# Patient Record
Sex: Female | Born: 1990 | Race: White | Hispanic: No | Marital: Single | Smoking: Former smoker
Health system: Southern US, Community
[De-identification: ages and names within clinical notes are randomized; demographics above are authoritative.]

## PROBLEM LIST (undated history)

## (undated) DIAGNOSIS — Z8619 Personal history of other infectious and parasitic diseases: Secondary | ICD-10-CM

## (undated) DIAGNOSIS — B9689 Other specified bacterial agents as the cause of diseases classified elsewhere: Secondary | ICD-10-CM

## (undated) DIAGNOSIS — N760 Acute vaginitis: Secondary | ICD-10-CM

## (undated) DIAGNOSIS — Z8742 Personal history of other diseases of the female genital tract: Secondary | ICD-10-CM

## (undated) HISTORY — DX: Personal history of other infectious and parasitic diseases: Z86.19

## (undated) HISTORY — DX: Acute vaginitis: N76.0

## (undated) HISTORY — PX: RECONSTRUCTION OF NOSE: SHX2301

## (undated) HISTORY — DX: Other specified bacterial agents as the cause of diseases classified elsewhere: B96.89

## (undated) HISTORY — DX: Personal history of other diseases of the female genital tract: Z87.42

---
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
## 2009-06-18 DIAGNOSIS — B9689 Other specified bacterial agents as the cause of diseases classified elsewhere: Secondary | ICD-10-CM

## 2009-06-18 HISTORY — DX: Acute vaginitis: B96.89

## 2011-04-11 ENCOUNTER — Encounter (HOSPITAL_COMMUNITY): Payer: Self-pay | Admitting: Emergency Medicine

## 2011-04-11 ENCOUNTER — Emergency Department (HOSPITAL_COMMUNITY)
Admission: EM | Admit: 2011-04-11 | Discharge: 2011-04-11 | Disposition: A | Discharge status: Home / Self Care | Payer: 59 | Attending: Emergency Medicine | Admitting: Emergency Medicine

## 2011-04-11 DIAGNOSIS — F17200 Nicotine dependence, unspecified, uncomplicated: Secondary | ICD-10-CM | POA: Insufficient documentation

## 2011-04-11 DIAGNOSIS — R51 Headache: Secondary | ICD-10-CM | POA: Insufficient documentation

## 2011-04-11 MED ORDER — KETOROLAC TROMETHAMINE 60 MG/2ML IM SOLN
60.0000 mg | Freq: Once | INTRAMUSCULAR | Status: AC
  Administered 2011-04-11: 60 mg via INTRAMUSCULAR
  Filled 2011-04-11: qty 2
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
## 2011-04-11 NOTE — ED Provider Notes (Signed)
Medical screening examination/treatment/procedure(s) were performed by non-physician practitioner and as supervising physician I was immediately available for consultation/collaboration.   Dafney Farler, MD 04/11/11 2340 

## 2011-06-29 ENCOUNTER — Ambulatory Visit: Payer: Self-pay | Admitting: Obstetrics and Gynecology

## 2012-11-13 ENCOUNTER — Other Ambulatory Visit: Payer: Self-pay | Admitting: Family Medicine

## 2012-11-13 ENCOUNTER — Other Ambulatory Visit (HOSPITAL_COMMUNITY)
Admission: RE | Admit: 2012-11-13 | Discharge: 2012-11-13 | Disposition: A | Discharge status: Home / Self Care | Payer: 59 | Source: Ambulatory Visit | Attending: Family Medicine | Admitting: Family Medicine

## 2012-11-13 DIAGNOSIS — Z01419 Encounter for gynecological examination (general) (routine) without abnormal findings: Secondary | ICD-10-CM | POA: Insufficient documentation

## 2012-11-13 DIAGNOSIS — Z113 Encounter for screening for infections with a predominantly sexual mode of transmission: Secondary | ICD-10-CM | POA: Insufficient documentation

## 2014-07-21 LAB — OB RESULTS CONSOLE ANTIBODY SCREEN: Antibody Screen: NEGATIVE

## 2014-07-21 LAB — OB RESULTS CONSOLE ABO/RH: RH TYPE: NEGATIVE

## 2014-07-21 LAB — OB RESULTS CONSOLE GC/CHLAMYDIA
Chlamydia: NEGATIVE
Gonorrhea: NEGATIVE

## 2014-07-21 LAB — OB RESULTS CONSOLE RPR: RPR: NONREACTIVE

## 2014-07-21 LAB — OB RESULTS CONSOLE HEPATITIS B SURFACE ANTIGEN: HEP B S AG: NEGATIVE

## 2014-07-21 LAB — OB RESULTS CONSOLE RUBELLA ANTIBODY, IGM: RUBELLA: IMMUNE

## 2014-07-21 LAB — OB RESULTS CONSOLE HIV ANTIBODY (ROUTINE TESTING): HIV: NONREACTIVE

## 2015-03-08 ENCOUNTER — Inpatient Hospital Stay (HOSPITAL_COMMUNITY)
Admission: AD | Admit: 2015-03-08 | Discharge: 2015-03-11 | DRG: 775 | Disposition: A | Discharge status: Home / Self Care | Payer: Commercial Managed Care - HMO | Source: Ambulatory Visit | Attending: Obstetrics and Gynecology | Admitting: Obstetrics and Gynecology

## 2015-03-08 ENCOUNTER — Encounter (HOSPITAL_COMMUNITY): Payer: Self-pay | Admitting: *Deleted

## 2015-03-08 DIAGNOSIS — D649 Anemia, unspecified: Secondary | ICD-10-CM

## 2015-03-08 DIAGNOSIS — Z825 Family history of asthma and other chronic lower respiratory diseases: Secondary | ICD-10-CM

## 2015-03-08 DIAGNOSIS — O480 Post-term pregnancy: Principal | ICD-10-CM | POA: Diagnosis present

## 2015-03-08 DIAGNOSIS — Z811 Family history of alcohol abuse and dependence: Secondary | ICD-10-CM

## 2015-03-08 DIAGNOSIS — O99344 Other mental disorders complicating childbirth: Secondary | ICD-10-CM | POA: Diagnosis present

## 2015-03-08 DIAGNOSIS — Z6791 Unspecified blood type, Rh negative: Secondary | ICD-10-CM

## 2015-03-08 DIAGNOSIS — Z823 Family history of stroke: Secondary | ICD-10-CM

## 2015-03-08 DIAGNOSIS — R51 Headache: Secondary | ICD-10-CM | POA: Diagnosis present

## 2015-03-08 DIAGNOSIS — F419 Anxiety disorder, unspecified: Secondary | ICD-10-CM

## 2015-03-08 DIAGNOSIS — F17210 Nicotine dependence, cigarettes, uncomplicated: Secondary | ICD-10-CM | POA: Diagnosis present

## 2015-03-08 DIAGNOSIS — Z6830 Body mass index (BMI) 30.0-30.9, adult: Secondary | ICD-10-CM

## 2015-03-08 DIAGNOSIS — Z3A40 40 weeks gestation of pregnancy: Secondary | ICD-10-CM

## 2015-03-08 DIAGNOSIS — O99334 Smoking (tobacco) complicating childbirth: Secondary | ICD-10-CM | POA: Diagnosis present

## 2015-03-08 DIAGNOSIS — Z818 Family history of other mental and behavioral disorders: Secondary | ICD-10-CM

## 2015-03-08 DIAGNOSIS — O26899 Other specified pregnancy related conditions, unspecified trimester: Secondary | ICD-10-CM

## 2015-03-08 DIAGNOSIS — Z8249 Family history of ischemic heart disease and other diseases of the circulatory system: Secondary | ICD-10-CM

## 2015-03-08 NOTE — MAU Note (Signed)
Pt reports contractions q 4-6 minutes.

## 2015-03-08 NOTE — L&D Delivery Note (Signed)
Final Labor Progress Note Prior to delivery FHR 120, remained reassuring with occasional variable decelerations. VE C/C/+2+3  Vaginal Delivery Note The pt utilized an epidural as pain management.   Artificial rupture of membranes today, at 0335, clear by Rachael Molina, CNM.  GBS was negtive.     Pushing with guidance began at  0803.   Soon after pushing began FHR deceled to the 90's.  At 0804 active intrauterine resuscitative measures began including replacement of FSE.  At 0808 the call for assistance to faculty practice physicians and Rachael Molina.  Rachael Molina arrived at the bedside at [phone].  Update given, assessment done, care assumed and decision for a vacuum was called.  A Kiwi vacuum was placed at 0813 by Rachael Molina.  Pushing continued with vacuum assistance.  Rachael Molina call into the room and was given an update.  At 0818 the FHR returned to baseline, moderate variability noted.   Pushing continued under Rachael Molina supervision.  At [phone] the tracing was reassessed, FHR was found to be WNL, I reassumed care and pushing continued.    After a total of 30 minutes of pushing, the head, shoulders and the body of a viable female infant "Rachael Molina" delivered spontaneously with maternal effort in the ROA position at [phone].  Leg cord x1, foot cord x1,  infant somersaulted thru without difficulty.  Moderate to thick meconium and a true knot was appreciated.   The infant was noted to have deminished tone, dusky color and spontaneous cry.  The infant was placed on moms abd for a quick assessment. After the umbilical cord was clamped it was cut, the infant was taken to the warmer for further assessment. Cord gas was collected and sent.  O2 sat were noted to be lower than normal and the NBN was call to come assess.  Nurse Rachael Molina arrived, soon after the infant was taken to the NICU for observation.  Spontaneous delivery of a intact meconium stain placenta with a 3 vessel cord via Shultz at  [phone].   Episiotomy:  None   The vulva, perineum, vaginal vault, rectum and cervix were inspected and revealed a 2 degree perineal/vaginal, repaired using a 3-0 vicryl on a CT needle and a superficial bilateral periurethral laceration which was repaired using a 4-0 vicryl on a SH.  Lidocaine was not used, the epidural was sufficient for the repair.   Patient tolerated repair well.   Postpartum pitocin as ordered.  Fundus firm, lochia minimum, bleeding under control. EBL 100, QBL 250.  Pt hemodynamically stable.   Sponge, laps and needle count correct and verified with the primary care nurse.  Attending MD available at all times.    Routine postpartum orders    Mother unsure about method of contraception  Mom plans to breastfeed   Placenta to pathology: YES Cord Gases sent to lab: NO Cord blood sent to lab: YES   APGARS:  7 at 1 minute and 9 at 5 minutes Weight:. pending        Rachael Molina, CNM, MSN 03/09/2015. 10:28 AM

## 2015-03-09 ENCOUNTER — Inpatient Hospital Stay (HOSPITAL_COMMUNITY): Payer: Commercial Managed Care - HMO | Admitting: Anesthesiology

## 2015-03-09 ENCOUNTER — Encounter (HOSPITAL_COMMUNITY): Payer: Self-pay | Admitting: *Deleted

## 2015-03-09 DIAGNOSIS — D649 Anemia, unspecified: Secondary | ICD-10-CM | POA: Diagnosis present

## 2015-03-09 DIAGNOSIS — Z3A40 40 weeks gestation of pregnancy: Secondary | ICD-10-CM | POA: Diagnosis not present

## 2015-03-09 DIAGNOSIS — F17210 Nicotine dependence, cigarettes, uncomplicated: Secondary | ICD-10-CM | POA: Diagnosis present

## 2015-03-09 DIAGNOSIS — Z8249 Family history of ischemic heart disease and other diseases of the circulatory system: Secondary | ICD-10-CM | POA: Diagnosis not present

## 2015-03-09 DIAGNOSIS — F419 Anxiety disorder, unspecified: Secondary | ICD-10-CM

## 2015-03-09 DIAGNOSIS — Z811 Family history of alcohol abuse and dependence: Secondary | ICD-10-CM | POA: Diagnosis not present

## 2015-03-09 DIAGNOSIS — Z825 Family history of asthma and other chronic lower respiratory diseases: Secondary | ICD-10-CM | POA: Diagnosis not present

## 2015-03-09 DIAGNOSIS — O99334 Smoking (tobacco) complicating childbirth: Secondary | ICD-10-CM | POA: Diagnosis present

## 2015-03-09 DIAGNOSIS — Z823 Family history of stroke: Secondary | ICD-10-CM | POA: Diagnosis not present

## 2015-03-09 DIAGNOSIS — O480 Post-term pregnancy: Secondary | ICD-10-CM | POA: Diagnosis present

## 2015-03-09 DIAGNOSIS — R51 Headache: Secondary | ICD-10-CM | POA: Diagnosis present

## 2015-03-09 DIAGNOSIS — Z6791 Unspecified blood type, Rh negative: Secondary | ICD-10-CM

## 2015-03-09 DIAGNOSIS — Z6830 Body mass index (BMI) 30.0-30.9, adult: Secondary | ICD-10-CM

## 2015-03-09 DIAGNOSIS — O99344 Other mental disorders complicating childbirth: Secondary | ICD-10-CM | POA: Diagnosis present

## 2015-03-09 DIAGNOSIS — O26899 Other specified pregnancy related conditions, unspecified trimester: Secondary | ICD-10-CM

## 2015-03-09 DIAGNOSIS — Z818 Family history of other mental and behavioral disorders: Secondary | ICD-10-CM | POA: Diagnosis not present

## 2015-03-09 LAB — CBC
HCT: 36.3 % (ref 36.0–46.0)
HEMOGLOBIN: 12.4 g/dL (ref 12.0–15.0)
MCH: 30.5 pg (ref 26.0–34.0)
MCHC: 34.2 g/dL (ref 30.0–36.0)
MCV: 89.4 fL (ref 78.0–100.0)
PLATELETS: 279 10*3/uL (ref 150–400)
RBC: 4.06 MIL/uL (ref 3.87–5.11)
RDW: 13 % (ref 11.5–15.5)
WBC: 14.8 10*3/uL — ABNORMAL HIGH (ref 4.0–10.5)

## 2015-03-09 MED ORDER — MORPHINE SULFATE (PF) 10 MG/ML IV SOLN
10.0000 mg | Freq: Once | INTRAVENOUS | Status: AC
Start: 2015-03-09 — End: 2015-03-09
  Administered 2015-03-09: 10 mg via INTRAMUSCULAR
  Filled 2015-03-09: qty 1

## 2015-03-09 MED ORDER — ONDANSETRON HCL 4 MG/2ML IJ SOLN: 4.0000 mg | INTRAMUSCULAR | Status: DC | PRN

## 2015-03-09 MED ORDER — SIMETHICONE 80 MG PO CHEW: 80.0000 mg | CHEWABLE_TABLET | ORAL | Status: DC | PRN

## 2015-03-09 MED ORDER — CITRIC ACID-SODIUM CITRATE 334-500 MG/5ML PO SOLN: 30.0000 mL | ORAL | Status: DC | PRN

## 2015-03-09 MED ORDER — OXYCODONE-ACETAMINOPHEN 5-325 MG PO TABS
1.0000 | ORAL_TABLET | ORAL | Status: DC | PRN
  Administered 2015-03-10 (×3): 1 via ORAL
  Filled 2015-03-09 (×3): qty 1

## 2015-03-09 MED ORDER — OXYCODONE-ACETAMINOPHEN 5-325 MG PO TABS: 1.0000 | ORAL_TABLET | ORAL | Status: DC | PRN

## 2015-03-09 MED ORDER — DIBUCAINE 1 % RE OINT
1.0000 "application " | TOPICAL_OINTMENT | RECTAL | Status: DC | PRN
  Filled 2015-03-09: qty 28

## 2015-03-09 MED ORDER — TETANUS-DIPHTH-ACELL PERTUSSIS 5-2.5-18.5 LF-MCG/0.5 IM SUSP: 0.5000 mL | Freq: Once | INTRAMUSCULAR | Status: DC

## 2015-03-09 MED ORDER — DIPHENHYDRAMINE HCL 50 MG/ML IJ SOLN: 12.5000 mg | INTRAMUSCULAR | Status: DC | PRN

## 2015-03-09 MED ORDER — ACETAMINOPHEN 325 MG PO TABS
650.0000 mg | ORAL_TABLET | ORAL | Status: DC | PRN
Start: 2015-03-09 — End: 2015-03-09

## 2015-03-09 MED ORDER — OXYCODONE-ACETAMINOPHEN 5-325 MG PO TABS: 2.0000 | ORAL_TABLET | ORAL | Status: DC | PRN

## 2015-03-09 MED ORDER — SENNOSIDES-DOCUSATE SODIUM 8.6-50 MG PO TABS
2.0000 | ORAL_TABLET | ORAL | Status: DC
  Administered 2015-03-10 – 2015-03-11 (×2): 2 via ORAL
  Filled 2015-03-09 (×2): qty 2

## 2015-03-09 MED ORDER — BENZOCAINE-MENTHOL 20-0.5 % EX AERO
1.0000 "application " | INHALATION_SPRAY | CUTANEOUS | Status: DC | PRN
  Administered 2015-03-09: 1 via TOPICAL
  Filled 2015-03-09 (×2): qty 56

## 2015-03-09 MED ORDER — LANOLIN HYDROUS EX OINT: TOPICAL_OINTMENT | CUTANEOUS | Status: DC | PRN

## 2015-03-09 MED ORDER — FLEET ENEMA 7-19 GM/118ML RE ENEM: 1.0000 | ENEMA | RECTAL | Status: DC | PRN

## 2015-03-09 MED ORDER — ONDANSETRON HCL 4 MG PO TABS: 4.0000 mg | ORAL_TABLET | ORAL | Status: DC | PRN

## 2015-03-09 MED ORDER — EPHEDRINE 5 MG/ML INJ: 10.0000 mg | INTRAVENOUS | Status: DC | PRN

## 2015-03-09 MED ORDER — LIDOCAINE HCL (PF) 1 % IJ SOLN
30.0000 mL | INTRAMUSCULAR | Status: DC | PRN
  Filled 2015-03-09: qty 30

## 2015-03-09 MED ORDER — OXYTOCIN BOLUS FROM INFUSION: 500.0000 mL | INTRAVENOUS | Status: DC

## 2015-03-09 MED ORDER — NALBUPHINE HCL 10 MG/ML IJ SOLN: 10.0000 mg | INTRAMUSCULAR | Status: DC | PRN

## 2015-03-09 MED ORDER — DIPHENHYDRAMINE HCL 25 MG PO CAPS: 25.0000 mg | ORAL_CAPSULE | Freq: Four times a day (QID) | ORAL | Status: DC | PRN

## 2015-03-09 MED ORDER — LACTATED RINGERS IV SOLN
INTRAVENOUS | Status: DC
  Administered 2015-03-09: 02:00:00 via INTRAVENOUS
  Administered 2015-03-09: 1000 mL via INTRAVENOUS

## 2015-03-09 MED ORDER — FENTANYL 2.5 MCG/ML BUPIVACAINE 1/10 % EPIDURAL INFUSION (WH - ANES)
14.0000 mL/h | INTRAMUSCULAR | Status: DC | PRN
  Administered 2015-03-09: 14 mL/h via EPIDURAL
  Filled 2015-03-09: qty 125

## 2015-03-09 MED ORDER — LIDOCAINE HCL (PF) 1 % IJ SOLN
INTRAMUSCULAR | Status: DC | PRN
  Administered 2015-03-09 (×2): 4 mL via EPIDURAL

## 2015-03-09 MED ORDER — ZOLPIDEM TARTRATE 5 MG PO TABS: 5.0000 mg | ORAL_TABLET | Freq: Every evening | ORAL | Status: DC | PRN

## 2015-03-09 MED ORDER — PROMETHAZINE HCL 25 MG/ML IJ SOLN
12.5000 mg | Freq: Once | INTRAMUSCULAR | Status: AC
  Administered 2015-03-09: 12.5 mg via INTRAMUSCULAR
  Filled 2015-03-09: qty 1

## 2015-03-09 MED ORDER — HYDROXYZINE HCL 50 MG PO TABS: 50.0000 mg | ORAL_TABLET | Freq: Four times a day (QID) | ORAL | Status: DC | PRN

## 2015-03-09 MED ORDER — PHENYLEPHRINE 40 MCG/ML (10ML) SYRINGE FOR IV PUSH (FOR BLOOD PRESSURE SUPPORT)
80.0000 ug | PREFILLED_SYRINGE | INTRAVENOUS | Status: DC | PRN
Start: 2015-03-09 — End: 2015-03-09
  Filled 2015-03-09: qty 20

## 2015-03-09 MED ORDER — LACTATED RINGERS IV SOLN
500.0000 mL | INTRAVENOUS | Status: DC | PRN
  Administered 2015-03-09: 500 mL via INTRAVENOUS
  Administered 2015-03-09: 1000 mL via INTRAVENOUS

## 2015-03-09 MED ORDER — IBUPROFEN 600 MG PO TABS
600.0000 mg | ORAL_TABLET | Freq: Four times a day (QID) | ORAL | Status: DC
  Administered 2015-03-09 – 2015-03-11 (×9): 600 mg via ORAL
  Filled 2015-03-09 (×9): qty 1

## 2015-03-09 MED ORDER — ACETAMINOPHEN 325 MG PO TABS: 650.0000 mg | ORAL_TABLET | ORAL | Status: DC | PRN

## 2015-03-09 MED ORDER — PRENATAL MULTIVITAMIN CH
1.0000 | ORAL_TABLET | Freq: Every day | ORAL | Status: DC
  Administered 2015-03-09 – 2015-03-11 (×3): 1 via ORAL
  Filled 2015-03-09 (×3): qty 1

## 2015-03-09 MED ORDER — WITCH HAZEL-GLYCERIN EX PADS: 1.0000 "application " | MEDICATED_PAD | CUTANEOUS | Status: DC | PRN

## 2015-03-09 MED ORDER — ONDANSETRON HCL 4 MG/2ML IJ SOLN: 4.0000 mg | Freq: Four times a day (QID) | INTRAMUSCULAR | Status: DC | PRN

## 2015-03-09 MED ORDER — OXYTOCIN 40 UNITS IN LACTATED RINGERS INFUSION - SIMPLE MED
62.5000 mL/h | INTRAVENOUS | Status: DC
Start: 2015-03-09 — End: 2015-03-09
  Administered 2015-03-09: 999 mL/h via INTRAVENOUS
  Filled 2015-03-09: qty 1000

## 2015-03-09 NOTE — Anesthesia Postprocedure Evaluation (Signed)
Anesthesia Post Note  Patient: Rachael Molina  Procedure(s) Performed: * No procedures listed *  Patient location during evaluation: Mother Baby Anesthesia Type: Epidural Level of consciousness: awake Pain management: pain level controlled Vital Signs Assessment: post-procedure vital signs reviewed and stable Respiratory status: spontaneous breathing Cardiovascular status: stable Postop Assessment: no headache, no backache, epidural receding, patient able to bend at knees, no signs of nausea or vomiting and adequate PO intake Anesthetic complications: no    Last Vitals:  Filed Vitals:   03/09/15 1053 03/09/15 1153  BP: 105/56 103/54  Pulse: 73 63  Temp: 37.1 C 37.1 C  Resp: 18 18    Last Pain:  Filed Vitals:   03/09/15 1312  PainSc: 0-No pain                 Herron Fero

## 2015-03-09 NOTE — H&P (Signed)
Rachael Molina is a 25 y.o. female, G1P0 at 40.4 weeks, presenting for regular contractions of increased intensity since ~ 11 pm on 03/08/15. Was 2/80/-2, -1 on initial exam. She was given pain medication and re-evaluated 1 hr later. Cvx at that time was 4/90/-1. She endorses +FM and denies bleeding or leaking. Of particular significance, pt lives ~ 40-45 min from hospital.   Patient Active Problem List   Diagnosis Date Noted  . Rh negative, maternal 03/09/2015  . Anxiety disorder 03/09/2015  . Anemia 03/09/2015  . Normal labor 03/09/2015  . BMI 30.0-30.9,adult 03/09/2015    History of present pregnancy: Patient entered care at 10.6 weeks.   EDC of 03/05/15 was established by sure LMP.   Anatomy scan: 21.4 weeks, with normal findings and a posterior placenta.   Additional [place] evaluations: None.   Significant prenatal events: Normal pregnancy complaints. No MAU visits. Received both flu and Tdap. TWG: 34 lbs.   Last evaluation: Office @ 39.6 wks by Dr. Normand Sloop. FHR 158 bpm. Cvx 0/50/-3. BP 100/60. Wt: 171 lbs.  OB History    Gravida Para Term Preterm AB TAB SAB Ectopic Multiple Living   1              Past Medical History  Diagnosis Date  . Migraine   . H/O measles   . H/O varicella   . H/O dysmenorrhea 03/24/06  . BV (bacterial vaginosis) 06/18/09   Past Surgical History  Procedure Laterality Date  . Reconstruction of nose     Family History: family history includes Asthma in her brother; Diabetes in her father; Heart disease in her father; Hypertension in her father; Mental illness in her mother; Stroke in her father.DM in her PGF, renal failure in her MGM, drug abuse in her paternal uncle, depression in her sister and paternal family history of alcoholism. Social History:  reports that she has been smoking.  She does not have any smokeless tobacco history on file. She reports that she drinks alcohol. She reports that she does not use illicit drugs. Patient is single, with FOB  Salli Quarry) involved and supportive.She is Caucasian with no religious preference. She will accept blood in an emergency. 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    Prenatal Transfer Tool  Maternal Diabetes: No Genetic Screening: Normal Panorama on 09/22/14 Maternal Ultrasounds/Referrals: Normal Fetal Ultrasounds or other Referrals:  None Maternal Substance Abuse:  No Significant Maternal Medications:  Meds include: Other: Iron, PNV Significant Maternal Lab Results: Lab values include: Group B Strep negative, Rh negative (received Rhogam on 12/22/14)  TDAP: 12/10/14 Flu: 12/10/14  ROS: 10 Systems reviewed and are negative for acute change except as noted in the HPI.   No Known Allergies   Dilation: 4 Effacement (%): 90 Station: -1 Exam by:: Sherre Scarlet CNm Blood pressure 111/70, pulse 60, temperature 97.5 F (36.4 C), temperature source Oral, resp. rate 18, height 5' 2.5" (1.588 m), weight 78.472 kg (173 lb), SpO2 98 %.  Chest clear Heart RRR without murmur Abd gravid, NT, FH CWD Pelvic: As above Ext: WNL EFW: 7-8 lbs FHR: BL 130 w/ moderate variability, +accels, no decels UCs: Every 2-5 minutes  Prenatal labs: ABO, Rh: --/--/A NEG (01/02 0145)  Antibody: POS (01/02 0145) Received Rhogam on 12/22/14 Rubella: Immune (07/21/14)  RPR: Nonreactive (05/16 0000)  HBsAg: Negative (05/16 0000)  HIV: Non-reactive (05/16 0000)  GBS: Neg (02/05/15) Sickle cell/Hgb electrophoresis: NA Pap: Neg 08/13/14 GC: Neg 07/21/14 &  02/05/15 Chlamydia: Neg 07/21/14 & 02/05/15 Genetic screenings: Normal Panorama Glucola: Abnormal at 146; 3hr GTT normal   Hgb 14.2 at NOB, 10.9 at 28 weeks   Assessment/Plan: IUP at 40.4 wks Latent labor -- cervical change s/p pain med Cat 1 FHRT GBS neg Rh neg H/O anxiety Lives in remote location Elevated BMI (31) Abnormal 1hr, normal 3hr GTT  [facility]: [facility] Rout[facility] orders Pain med/epidural  prn Active labor management Expect progress and SVD Consult prn   Robyne AskewWILLIAMS, KIMBERLYCNM, MS 03/09/2015, 3:17 AM

## 2015-03-09 NOTE — Anesthesia Preprocedure Evaluation (Signed)
Anesthesia Evaluation  Patient identified by MRN, date of birth, ID band Patient awake    Reviewed: Allergy & Precautions, NPO status , Patient's Chart, lab work & pertinent test results  Airway Mallampati: II  TM Distance: >3 FB Neck ROM: Full    Dental  (+) Teeth Intact   Pulmonary Current Smoker,    breath sounds clear to auscultation       Cardiovascular negative cardio ROS   Rhythm:Regular Rate:Normal     Neuro/Psych  Headaches, PSYCHIATRIC DISORDERS    GI/Hepatic negative GI ROS, Neg liver ROS,   Endo/Other  negative endocrine ROS  Renal/GU negative Renal ROS  negative genitourinary   Musculoskeletal negative musculoskeletal ROS (+)   Abdominal   Peds negative pediatric ROS (+)  Hematology   Anesthesia Other Findings   Reproductive/Obstetrics (+) Pregnancy                             Lab Results  Component Value Date   WBC 14.8* 03/09/2015   HGB 12.4 03/09/2015   HCT 36.3 03/09/2015   MCV 89.4 03/09/2015   PLT 279 03/09/2015   No results found for: INR, PROTIME   Anesthesia Physical Anesthesia Plan  ASA: II  Anesthesia Plan: Epidural   Post-op Pain Management:    Induction:   Airway Management Planned:   Additional Equipment:   Intra-op Plan:   Post-operative Plan:   Informed Consent: I have reviewed the patients History and Physical, chart, labs and discussed the procedure including the risks, benefits and alternatives for the proposed anesthesia with the patient or authorized representative who has indicated his/her understanding and acceptance.     Plan Discussed with:   Anesthesia Plan Comments:         Anesthesia Quick Evaluation

## 2015-03-09 NOTE — Progress Notes (Signed)
V Standard CNM requested RN to bedside for delivery.

## 2015-03-09 NOTE — MAU Provider Note (Signed)
  History  25 yo G1P0 @ 40.4 wks presents unannounced to MAU w/ c/o ctxs every 4-6 minutes x 1 hour. +FM. Denies leaking or bleeding.  Patient Active Problem List   Diagnosis Date Noted  . Rh negative, maternal 03/09/2015  . Anxiety disorder 03/09/2015  . Anemia 03/09/2015    No chief complaint on file.  HPI As above OB History    Gravida Para Term Preterm AB TAB SAB Ectopic Multiple Living   1               Past Medical History  Diagnosis Date  . Migraine   . H/O measles   . H/O varicella   . H/O dysmenorrhea 03/24/06  . BV (bacterial vaginosis) 06/18/09    Past Surgical History  Procedure Laterality Date  . Reconstruction of nose      Family History  Problem Relation Age of Onset  . Mental illness Mother   . Heart disease Father   . Hypertension Father   . Diabetes Father   . Stroke Father   . Asthma Brother     Social History  Substance Use Topics  . Smoking status: Current Every Day Smoker -- 0.50 packs/day  . Smokeless tobacco: None  . Alcohol Use: Yes     Comment: occassional, not hwile pregnant    Allergies: No Known Allergies  Prescriptions prior to admission  Medication Sig Dispense Refill Last Dose  . ferrous sulfate 325 (65 FE) MG tablet Take 325 mg by mouth daily with breakfast.   03/08/2015 at Unknown time  . prenatal vitamin w/FE, FA (PRENATAL 1 + 1) 27-1 MG TABS tablet Take 1 tablet by mouth daily at 12 noon.   03/08/2015 at Unknown time  . Biotin 10 MG TABS Take 1 tablet by mouth daily.   More than a month at Unknown time  . ibuprofen (ADVIL,MOTRIN) 200 MG tablet Take 800 mg by mouth every 6 (six) hours as needed. pain   More than a month at Unknown time    ROS  +Ctxs -VB -LOF +FM Physical Exam   Blood pressure 110/80, pulse 100, temperature 98.2 F (36.8 C), temperature source Oral, resp. rate 20, height 5' 2.5" (1.588 m), weight 78.472 kg (173 lb), SpO2 98 %.  Physical Exam Gen: Anxious & tearful Abdomen: gravid, soft between ctxs,  NT Pelvic: 2/80/-2, -1 per RN FHR: BL 130 w/ moderate variability, +accels, no decels ED Course  Assessment: Latent labor Cat 1 FHRT Anxiety disorder  Plan: Reviewed findings of latent labor. Gave option to ambulate x 2 hours, receive pain medication while in MAU or return home. Pt elected pain medication. Morphine 10 mg IM and Phenergan 12.5 mg IM ordered -- NKDA. Was scheduled for IOL on 03/13/15 w/ NST in office on 03/11/15.     Sherre ScarletWILLIAMS, Bishoy Cupp CNM, MS 03/09/2015 12:40 AM  ADDENDUM: Cvx re-examined and noted to be 4/90/-1.   P: Admit

## 2015-03-09 NOTE — Progress Notes (Signed)
FOB at bedside.  Subjective: Comfortable w/ epidural.  Objective: BP 111/70 mmHg  Pulse 60  Temp(Src) 97.5 F (36.4 C) (Oral)  Resp 18  Ht 5' 2.5" (1.588 m)  Wt 78.472 kg (173 lb)  BMI 31.12 kg/m2  SpO2 98%  Today's Vitals   03/09/15 0257 03/09/15 0300 03/09/15 0305 03/09/15 0310  BP: 102/63 99/59 103/61 111/70  Pulse: 62 59 57 60  Temp:      TempSrc:      Resp: 18 18    Height:      Weight:      SpO2:      PainSc: Asleep Asleep Asleep     FHT: BL 110-120 w/ moderate variability, +accels, audible decels UC:   irregular, every 3-5 minutes SVE: 5-6/90/-1 AROM'd, scant amount of blood tinged fluid noted. FSE placed due to inability to adequately trace FHR externally, and audible decels. Strip noted to be Cat 1 after FSE placed   Assessment:  IUP at 40.4 wks Cat 1 FHRT -- suspect audible decels a result of fetal descent Progressive labor GBS neg   Plan: Expect progress and SVD Consult prn  Sherre ScarletWILLIAMS, Rigby Swamy CNM 03/09/2015, 3:49 AM

## 2015-03-09 NOTE — Lactation Note (Signed)
This note was copied from the chart of Rachael Lelon FrohlichMegan Schnoor. Lactation Consultation Note Initial visit at 8 hours of age.  Mom reports baby just returning to room and was in the nursery for respiratory issue.  Nursery Rn at bedside to check respiratory rate and advised ok to attempt feeding.  Assisted with hand expression nipples are short semiflat and flatten more with compression. Small glisten of colostrum noted on left breast.  Attempted cross cradle hold baby fussy and not willing to latch.  LC gave mom hand pump with instructions on use.  LC attempted hand pumping with nipple slightly more everted.  Encouraged mom to continue to work on hand expression or hand pumping and keep baby STS as much as she can.  Discussed options of spoon feeding if needed and small snappy container given to collect EBM. Baby back to sleep on moms chest.  [facility] given and discussed.  Encouraged to feed with early cues on demand.  Early newborn behavior discussed.  Mom to call for assist as needed.      Patient Name: Rachael Molina KGMWN'UToday's Date: 03/09/2015 Reason for consult: Initial assessment   Maternal Data Has patient been taught Hand Expression?: Yes Does the patient have breastfeeding experience prior to this delivery?: No  Feeding Feeding Type: Breast Fed  LATCH Score/Interventions Latch: Too sleepy or reluctant, no latch achieved, no sucking elicited. Intervention(s): Skin to skin;Teach feeding cues;Waking techniques  Audible Swallowing: None  Type of Nipple: Flat Intervention(s): Hand pump  Comfort (Breast/Nipple): Soft / non-tender     Hold (Positioning): Assistance needed to correctly position infant at breast and maintain latch. Intervention(s): Breastfeeding basics reviewed;Support Pillows;Position options;Skin to skin  LATCH Score: 4  Lactation Tools Discussed/Used WIC Program: No Pump Review: Setup, frequency, and cleaning Initiated by:: JS Date initiated::  03/09/15   Consult Status Consult Status: Follow-up Date: 03/10/15 Follow-up type: In-patient    [name], [name] 03/09/2015, 5:29 PM

## 2015-03-09 NOTE — Progress Notes (Signed)
FOB at bedside.  Subjective: Comfortable w/ epidural. No urge to push.  Objective: BP 108/66 mmHg  Pulse 71  Temp(Src) 98 F (36.7 C) (Oral)  Resp 18  Ht 5' 2.5" (1.588 m)  Wt 78.472 kg (173 lb)  BMI 31.12 kg/m2  SpO2 98% Today's Vitals   03/09/15 0530 03/09/15 0552 03/09/15 0600 03/09/15 0630  BP: 98/66  111/69 108/66  Pulse: 67  63 71  Temp:      TempSrc:      Resp: 18   18  Height:      Weight:      SpO2:      PainSc:  Asleep  Asleep    FHT: BL 120 w/ moderate variability, +accels, intermittent variables, earlys UC:   irregular, every 1-3 minutes SVE:   Dilation: Lip/rim Effacement (%): 90 Station: 0, +1 Exam by:: Thornell MuleK Nusrat Encarnacion CNM @ [phone]   Assessment:  Approaching 2nd stage labor Overall reassuring FHRT GBS neg  Plan: Peanut/position change Expect further progress and SVD  Sherre ScarletWILLIAMS, Abimelec Grochowski CNM 03/09/2015, 6:35 AM

## 2015-03-09 NOTE — Progress Notes (Signed)
UR chart review completed.  

## 2015-03-09 NOTE — Anesthesia Procedure Notes (Signed)
Epidural Patient location during procedure: OB Start time: 03/09/2015 2:38 AM End time: 03/09/2015 2:42 AM  Staffing Anesthesiologist: Shona SimpsonHOLLIS, Rachael Molina Performed by: anesthesiologist   Preanesthetic Checklist Completed: patient identified, site marked, surgical consent, pre-op evaluation, timeout performed, IV checked, risks and benefits discussed and monitors and equipment checked  Epidural Patient position: sitting Prep: ChloraPrep Patient monitoring: heart rate, continuous pulse ox and blood pressure Approach: midline Location: L3-L4 Injection technique: LOR saline  Needle:  Needle type: Tuohy  Needle gauge: 17 G Needle length: 9 cm Catheter type: closed end flexible Catheter size: 20 Guage Test dose: negative and 1.5% lidocaine  Assessment Events: blood not aspirated, injection not painful, no injection resistance and no paresthesia  Additional Notes LOR @ 5  Patient identified. Risks/Benefits/Options discussed with patient including but not limited to bleeding, infection, nerve damage, paralysis, failed block, incomplete pain control, headache, blood pressure changes, nausea, vomiting, reactions to medications, itching and postpartum back pain. Confirmed with bedside nurse the patient's most recent platelet count. Confirmed with patient that they are not currently taking any anticoagulation, have any bleeding history or any family history of bleeding disorders. Patient expressed understanding and wished to proceed. All questions were answered. Sterile technique was used throughout the entire procedure. Please see nursing notes for vital signs. Test dose was given through epidural catheter and negative prior to continuing to dose epidural or start infusion. Warning signs of high block given to the patient including shortness of breath, tingling/numbness in hands, complete motor block, or any concerning symptoms with instructions to call for help. Patient was given instructions on  fall risk and not to get out of bed. All questions and concerns addressed with instructions to call with any issues or inadequate analgesia.    Reason for block:procedure for pain

## 2015-03-10 LAB — CBC
HEMATOCRIT: 33 % — AB (ref 36.0–46.0)
HEMOGLOBIN: 11 g/dL — AB (ref 12.0–15.0)
MCH: 30.8 pg (ref 26.0–34.0)
MCHC: 33.3 g/dL (ref 30.0–36.0)
MCV: 92.4 fL (ref 78.0–100.0)
Platelets: 237 10*3/uL (ref 150–400)
RBC: 3.57 MIL/uL — ABNORMAL LOW (ref 3.87–5.11)
RDW: 13.2 % (ref 11.5–15.5)
WBC: 11.2 10*3/uL — AB (ref 4.0–10.5)

## 2015-03-10 LAB — RPR: RPR Ser Ql: NONREACTIVE

## 2015-03-10 MED ORDER — RHO D IMMUNE GLOBULIN 1500 UNIT/2ML IJ SOSY
300.0000 ug | PREFILLED_SYRINGE | Freq: Once | INTRAMUSCULAR | Status: AC
Start: 2015-03-10 — End: 2015-03-10
  Administered 2015-03-10: 300 ug via INTRAVENOUS
  Filled 2015-03-10: qty 2

## 2015-03-10 NOTE — Lactation Note (Deleted)
This note was copied from the chart of Rachael Lelon FrohlichMegan Boyadjian. Lactation Consultation Note Mom needed assistance latching baby. Mom has short shaft nipples. Very compressible. Wide space between tubular soft breast. Mom denied PCOS. Assisted in football position, discussed positioning, turning body facing mom, STS, and compressing breast for latching. Discussed props and comfort. Mom needed assistance mainly in positioning and guidance on latching.  Obtained good deep latch, heard ocassional swallow. Demonstrated hand expression to see colostrum.  Patient Name: Rachael Molina WUJWJ'XToday's Date: 03/10/2015 Reason for consult: Follow-up assessment   Maternal Data    Feeding Feeding Type: Bottle Fed - Formula Nipple Type: Slow - flow Length of feed: 5 min (still BF)  LATCH Score/Interventions Latch: Repeated attempts needed to sustain latch, nipple held in mouth throughout feeding, stimulation needed to elicit sucking reflex. Intervention(s): Skin to skin;Teach feeding cues;Waking techniques Intervention(s): Adjust position;Assist with latch;Breast massage;Breast compression  Audible Swallowing: A few with stimulation Intervention(s): Hand expression;Alternate breast massage  Type of Nipple: Everted at rest and after stimulation (short shaft)  Comfort (Breast/Nipple): Soft / non-tender     Hold (Positioning): Assistance needed to correctly position infant at breast and maintain latch. Intervention(s): Support Pillows;Position options;Skin to skin  LATCH Score: 7  Lactation Tools Discussed/Used     Consult Status Consult Status: Follow-up Date: 03/10/15 Follow-up type: In-patient    Charyl DancerCARVER, Kebin Maye G 03/10/2015, 6:25 AM

## 2015-03-10 NOTE — Progress Notes (Signed)
Subjective: Postpartum Day 1:  Vaginal delivery, 2nd degree perineal/vagina, superficial bilateral periurethral lacerations Patient up ad lib, reports no syncope or dizziness. Feeding:  Breast Contraceptive plan:  Undecided  Baby under bili blanket.  Baby A+.  Objective: Vital signs in last 24 hours: Temp:  [98 F (36.7 C)-98.7 F (37.1 C)] 98 F (36.7 C) (01/03 0538) Pulse Rate:  [60-88] 60 (01/03 0538) Resp:  [16-20] 20 (01/03 0538) BP: (81-121)/(50-78) 98/57 mmHg (01/03 0538) SpO2:  [98 %] 98 % (01/02 1153)  Physical Exam:  General: alert Lochia: appropriate Uterine Fundus: firm Perineum: healing well DVT Evaluation: No evidence of DVT seen on physical exam. Negative Homan's sign.   CBC Latest Ref Rng 03/10/2015 03/09/2015  WBC 4.0 - 10.5 K/uL 11.2(H) 14.8(H)  Hemoglobin 12.0 - 15.0 g/dL 11.0(L) 12.4  Hematocrit 36.0 - 46.0 % 33.0(L) 36.3  Platelets 150 - 400 K/uL 237 279     Assessment/Plan: Status post vaginal delivery day 1. Stable Continue current care. Rhophylac prior to d/c. Plan for discharge tomorrow     Nyra CapesLATHAM, VICKICNM 03/10/2015, 8:45 AM

## 2015-03-10 NOTE — Lactation Note (Signed)
This note was copied from the chart of Rachael Molina. Lactation Consultation Note  Patient Name: Rachael Lelon FrohlichMegan Greathouse AVWUJ'WToday's Date: 03/10/2015 Reason for consult: Follow-up assessment;Difficult latch Baby at 33 hr of life and mom reports baby is not bf well. Baby just latched for the 1st time to the R breast earlier today. Parents claim that no one has been around to help them, but mom is resistant to most of the bf information offered. Discussed feeding frequency, breast changes, nipple care, manual expression, artifical nipples, supplementing, and pumping. Mom stated that she "does not want a lecture on the pacifier because it is a personal preference". She refused supplementing with something other than a bottle nipple, even after the other choices were explained. She stated that "I do not want the baby on me all the time". She also said that she wants to mostly bf and only use formula if it is "medically needed". Then she requested more of the 22 cal formula. She did agree to start using the DEBP today. She will offer the breast for at least 10 minutes each side 8+/24hr and f/u each bf with her pumped milk or formula per the supplementing guidelines. She is aware of OP services and support group.       Maternal Data    Feeding Feeding Type: Formula Nipple Type: Slow - flow Length of feed: 20 min  LATCH Score/Interventions Latch: Repeated attempts needed to sustain latch, nipple held in mouth throughout feeding, stimulation needed to elicit sucking reflex. Intervention(s): Skin to skin;Waking techniques;Teach feeding cues Intervention(s): Adjust position;Assist with latch;Breast compression  Audible Swallowing: A few with stimulation Intervention(s): Skin to skin;Hand expression  Type of Nipple: Flat Intervention(s): Hand pump  Comfort (Breast/Nipple): Soft / non-tender     Hold (Positioning): Assistance needed to correctly position infant at breast and maintain  latch. Intervention(s): Breastfeeding basics reviewed;Support Pillows;Position options;Skin to skin  LATCH Score: 6  Lactation Tools Discussed/Used Pump Review: Setup, frequency, and cleaning;Milk Storage Initiated by:: ES Date initiated:: 03/10/15   Consult Status Consult Status: Follow-up Date: 03/11/15 Follow-up type: In-patient    Rulon Eisenmengerlizabeth E Jaquil Todt 03/10/2015, 5:59 PM

## 2015-03-10 NOTE — Lactation Note (Signed)
This note was copied from the chart of Rachael Molina. Lactation Consultation Note RN concerned that mom hadn't BF baby and had went to wake mom up several times and mom was sleeping. RN spoke with me reguarding mom not BF baby and all was sleeping. Mom also using pacifier during the night. Spoke with mom asking her why the baby hasn't BF during the night. Mom stated baby wasn't hungry and wouldn't latch. Encouraged mom to call for assistance if can't get baby to latch. Mentioned mom using pacifier, explained that if baby would suck on pacifer probably would latch onto breast. RN reported mom wanted a bottle to give formula to baby. When I came in and introduced myself, mom immediately told me she wanted a bottle of formula to give to baby. Asked what was her plans on feeding the baby. Stated she was going to breast and bottle, the baby wouldn't latch and needed a bottle. Offered to assist in latching, mom said no I'm giving a bottle right now. Explained baby was 6 lbs 4 oz. And baby's weight will probably drop to below 6 lbs and and needed to feed frequently. Gave 22 cal. Formula. Discussed importance of feeding baby,STS, and I&O. Discussed hand expression.  I'm not feeling like mom is to interested in BF at this time. Will cont. To encouraged and teach.  Patient Name: Rachael Molina ZOXWR'UToday's Molina: 03/10/2015 Reason for consult: Follow-up assessment   Maternal Data    Feeding Feeding Type: Bottle Fed - Formula Nipple Type: Slow - flow Length of feed: 5 min (still BF)  LATCH Score/Interventions Latch: Repeated attempts needed to sustain latch, nipple held in mouth throughout feeding, stimulation needed to elicit sucking reflex. Intervention(s): Skin to skin;Teach feeding cues;Waking techniques Intervention(s): Adjust position;Assist with latch;Breast massage;Breast compression  Audible Swallowing: A few with stimulation Intervention(s): Hand expression;Alternate breast massage  Type of  Nipple: Everted at rest and after stimulation (short shaft)  Comfort (Breast/Nipple): Soft / non-tender     Hold (Positioning): Assistance needed to correctly position infant at breast and maintain latch. Intervention(s): Support Pillows;Position options;Skin to skin  LATCH Score: 7  Lactation Tools Discussed/Used     Consult Status Consult Status: Follow-up Molina: 03/10/15 Follow-up type: In-patient    Rachael DancerCARVER, Rachael Molina 03/10/2015, 6:36 AM

## 2015-03-11 LAB — RH IG WORKUP (INCLUDES ABO/RH)
ABO/RH(D): A NEG
FETAL SCREEN: NEGATIVE
GESTATIONAL AGE(WKS): 40.4
UNIT DIVISION: 0

## 2015-03-11 MED ORDER — IBUPROFEN 600 MG PO TABS: 600.0000 mg | ORAL_TABLET | Freq: Four times a day (QID) | ORAL | Status: DC | PRN

## 2015-03-11 MED ORDER — OXYCODONE-ACETAMINOPHEN 5-325 MG PO TABS: 1.0000 | ORAL_TABLET | ORAL | Status: DC | PRN

## 2015-03-11 NOTE — Discharge Instructions (Signed)
Postpartum Care After Vaginal Delivery °After you deliver your newborn (postpartum period), the usual stay in the hospital is 24-72 hours. If there were problems with your labor or delivery, or if you have other medical problems, you might be in the hospital longer.  °While you are in the hospital, you will receive help and instructions on how to care for yourself and your newborn during the postpartum period.  °While you are in the hospital: °· Be sure to tell your nurses if you have pain or discomfort, as well as where you feel the pain and what makes the pain worse. °· If you had an incision made near your vagina (episiotomy) or if you had some tearing during delivery, the nurses may put ice packs on your episiotomy or tear. The ice packs may help to reduce the pain and swelling. °· If you are breastfeeding, you may feel uncomfortable contractions of your uterus for a couple of weeks. This is normal. The contractions help your uterus get back to normal size. °· It is normal to have some bleeding after delivery. °¨ For the first 1-3 days after delivery, the flow is red and the amount may be similar to a period. °¨ It is common for the flow to start and stop. °¨ In the first few days, you may pass some small clots. Let your nurses know if you begin to pass large clots or your flow increases. °¨ Do not  flush blood clots down the toilet before having the nurse look at them. °¨ During the next 3-10 days after delivery, your flow should become more watery and pink or brown-tinged in color. °¨ Ten to fourteen days after delivery, your flow should be a small amount of yellowish-white discharge. °¨ The amount of your flow will decrease over the first few weeks after delivery. Your flow may stop in 6-8 weeks. Most women have had their flow stop by 12 weeks after delivery. °· You should change your sanitary pads frequently. °· Wash your hands thoroughly with soap and water for at least 20 seconds after changing pads, using  the toilet, or before holding or feeding your newborn. °· You should feel like you need to empty your bladder within the first 6-8 hours after delivery. °· In case you become weak, lightheaded, or faint, call your nurse before you get out of bed for the first time and before you take a shower for the first time. °· Within the first few days after delivery, your breasts may begin to feel tender and full. This is called engorgement. Breast tenderness usually goes away within 48-72 hours after engorgement occurs. You may also notice milk leaking from your breasts. If you are not breastfeeding, do not stimulate your breasts. Breast stimulation can make your breasts produce more milk. °· Spending as much time as possible with your newborn is very important. During this time, you and your newborn can feel close and get to know each other. Having your newborn stay in your room (rooming in) will help to strengthen the bond with your newborn.  It will give you time to get to know your newborn and become comfortable caring for your newborn. °· Your hormones change after delivery. Sometimes the hormone changes can temporarily cause you to feel sad or tearful. These feelings should not last more than a few days. If these feelings last longer than that, you should talk to your caregiver. °· If desired, talk to your caregiver about methods of family planning or contraception. °·   Talk to your caregiver about immunizations. Your caregiver may want you to have the following immunizations before leaving the hospital: °¨ Tetanus, diphtheria, and pertussis (Tdap) or tetanus and diphtheria (Td) immunization. It is very important that you and your family (including grandparents) or others caring for your newborn are up-to-date with the Tdap or Td immunizations. The Tdap or Td immunization can help protect your newborn from getting ill. °¨ Rubella immunization. °¨ Varicella (chickenpox) immunization. °¨ Influenza immunization. You should  receive this annual immunization if you did not receive the immunization during your pregnancy. °  °This information is not intended to replace advice given to you by your health care provider. Make sure you discuss any questions you have with your health care provider. °  °Document Released: 12/19/2006 Document Revised: 11/16/2011 Document Reviewed: 10/19/2011 °Elsevier Interactive Patient Education ©2016 Elsevier Inc. ° °Levonorgestrel intrauterine device (IUD) °What is this medicine? °LEVONORGESTREL IUD (LEE voe nor jes trel) is a contraceptive (birth control) device. The device is placed inside the uterus by a healthcare professional. It is used to prevent pregnancy and can also be used to treat heavy bleeding that occurs during your period. Depending on the device, it can be used for 3 to 5 years. °This medicine may be used for other purposes; ask your health care provider or pharmacist if you have questions. °What should I tell my health care provider before I take this medicine? °They need to know if you have any of these conditions: °-abnormal Pap smear °-cancer of the breast, uterus, or cervix °-diabetes °-endometritis °-genital or pelvic infection now or in the past °-have more than one sexual partner or your partner has more than one partner °-heart disease °-history of an ectopic or tubal pregnancy °-immune system problems °-IUD in place °-liver disease or tumor °-problems with blood clots or take blood-thinners °-use intravenous drugs °-uterus of unusual shape °-vaginal bleeding that has not been explained °-an unusual or allergic reaction to levonorgestrel, other hormones, silicone, or polyethylene, medicines, foods, dyes, or preservatives °-pregnant or trying to get pregnant °-breast-feeding °How should I use this medicine? °This device is placed inside the uterus by a health care professional. °Talk to your pediatrician regarding the use of this medicine in children. Special care may be  needed. °Overdosage: If you think you have taken too much of this medicine contact a poison control center or emergency room at once. °NOTE: This medicine is only for you. Do not share this medicine with others. °What if I miss a dose? °This does not apply. °What may interact with this medicine? °Do not take this medicine with any of the following medications: °-amprenavir °-bosentan °-fosamprenavir °This medicine may also interact with the following medications: °-aprepitant °-barbiturate medicines for inducing sleep or treating seizures °-bexarotene °-griseofulvin °-medicines to treat seizures like carbamazepine, ethotoin, felbamate, oxcarbazepine, phenytoin, topiramate °-modafinil °-pioglitazone °-rifabutin °-rifampin °-rifapentine °-some medicines to treat HIV infection like atazanavir, indinavir, lopinavir, nelfinavir, tipranavir, ritonavir °-St. John's wort °-warfarin °This list may not describe all possible interactions. Give your health care provider a list of all the medicines, herbs, non-prescription drugs, or dietary supplements you use. Also tell them if you smoke, drink alcohol, or use illegal drugs. Some items may interact with your medicine. °What should I watch for while using this medicine? °Visit your doctor or health care professional for regular check ups. See your doctor if you or your partner has sexual contact with others, becomes HIV positive, or gets a sexual transmitted disease. °This product does   not protect you against HIV infection (AIDS) or other sexually transmitted diseases. °You can check the placement of the IUD yourself by reaching up to the top of your vagina with clean fingers to feel the threads. Do not pull on the threads. It is a good habit to check placement after each menstrual period. Call your doctor right away if you feel more of the IUD than just the threads or if you cannot feel the threads at all. °The IUD may come out by itself. You may become pregnant if the device  comes out. If you notice that the IUD has come out use a backup birth control method like condoms and call your health care provider. °Using tampons will not change the position of the IUD and are okay to use during your period. °What side effects may I notice from receiving this medicine? °Side effects that you should report to your doctor or health care professional as soon as possible: °-allergic reactions like skin rash, itching or hives, swelling of the face, lips, or tongue °-fever, flu-like symptoms °-genital sores °-high blood pressure °-no menstrual period for 6 weeks during use °-pain, swelling, warmth in the leg °-pelvic pain or tenderness °-severe or sudden headache °-signs of pregnancy °-stomach cramping °-sudden shortness of breath °-trouble with balance, talking, or walking °-unusual vaginal bleeding, discharge °-yellowing of the eyes or skin °Side effects that usually do not require medical attention (report to your doctor or health care professional if they continue or are bothersome): °-acne °-breast pain °-change in sex drive or performance °-changes in weight °-cramping, dizziness, or faintness while the device is being inserted °-headache °-irregular menstrual bleeding within first 3 to 6 months of use °-nausea °This list may not describe all possible side effects. Call your doctor for medical advice about side effects. You may report side effects to FDA at 1-800-FDA-1088. °Where should I keep my medicine? °This does not apply. °NOTE: This sheet is a summary. It may not cover all possible information. If you have questions about this medicine, talk to your doctor, pharmacist, or health care provider. °  °© 2016, Elsevier/[employer]. (2011-03-24 13:54:04) ° °

## 2015-03-11 NOTE — Discharge Summary (Signed)
OB Discharge Summary     Patient Name: Rachael Molina DOB: [birth year] MRN: [id number]  Date of admission: 03/08/2015 Delivering MD: Adelina Mings   Date of discharge: 03/11/2015  Admitting diagnosis: PREG Intrauterine pregnancy: [redacted]w[redacted]d     Secondary diagnosis:  Principal Problem:   Normal vaginal delivery Active Problems:   Rh negative, maternal   Anxiety disorder   Anemia   BMI 30.0-30.9,adult  Additional problems: None     Discharge diagnosis: Term Pregnancy Delivered                                                                                                Post partum procedures:None  Augmentation: None  Complications: None  Hospital course:  Onset of Labor With Vaginal Delivery     25 y.o. yo G1P1001 at [redacted]w[redacted]d was admitted in Active Labor on 03/08/2015. Patient had an uncomplicated labor course as follows:  Membrane Rupture Time/Date: 3:35 AM ,03/09/2015   Intrapartum Procedures: Episiotomy: None [1]                                         Lacerations:  1st degree [2];Perineal [11]  Patient had a delivery of a Viable infant. 03/09/2015  Information for the patient's newborn:  Tymika, Grilli [[id number]]  Delivery Method: Vaginal, Vacuum (Extractor) (Filed from Delivery Summary)   Patient had very efficient labor, requiring no augmentation.  Decels noted in 2nd stage, and VE was applied for assistance by Dr. Adrian Blackwater.  It was then removed and the patient was delivered spontaneously by [facility] Standard, CNM.  Baby had decreased tone and dusky color, was taken to [facility] for assessment, then was able to return to mother's bedside after patient's transfer to [facility].  Patient reported a desire to breast feed, but elected to formula feed frequently during her hospital stay.  Baby did have jaundice, and was under the bili blanket starting early on day 1 of life.  Baby remained on the blanket at the time of mother's d/c, with rooming in implemented.  Pateint had an  uncomplicated postpartum course.  She is ambulating, tolerating a regular diet, passing flatus, and urinating well. Patient is discharged home in stable condition on 03/12/2015.    Physical exam  Filed Vitals:   03/10/15 0000 03/10/15 0538 03/10/15 1808 03/11/15 0622  BP: 121/56 98/57 116/68 118/76  Pulse: 82 60 78 74  Temp: 98.2 F (36.8 C) 98 F (36.7 C) 98.4 F (36.9 C) 98 F (36.7 C)  TempSrc: Oral Oral Oral Oral  Resp: 20 20 18 18   Height:      Weight:      SpO2:       General: alert Lochia: appropriate Uterine Fundus: firm Incision: Healing well with no significant drainage DVT Evaluation: No evidence of DVT seen on physical exam. Negative Homan's sign. Labs: CBC Latest Ref Rng 03/10/2015 03/09/2015  WBC 4.0 - 10.5 K/uL 11.2(H) 14.8(H)  Hemoglobin 12.0 - 15.0 g/dL 11.0(L) 12.4  Hematocrit 36.0 - 46.0 %  33.0(L) 36.3  Platelets 150 - 400 K/uL 237 279    No flowsheet data found.  Discharge instruction: per After Visit Summary and "Baby and Me Booklet".  After visit meds:    Medication List    TAKE these medications        ferrous sulfate 325 (65 FE) MG tablet  Take 650 mg by mouth daily with breakfast.     ibuprofen 600 MG tablet  Commonly known as:  ADVIL,MOTRIN  Take 1 tablet (600 mg total) by mouth every 6 (six) hours as needed.     oxyCODONE-acetaminophen 5-325 MG tablet  Commonly known as:  PERCOCET/ROXICET  Take 1 tablet by mouth every 4 (four) hours as needed (for pain scale 4-7).     prenatal vitamin w/FE, FA 27-1 MG Tabs tablet  Take 1 tablet by mouth daily at 12 noon.        Diet: routine diet  Activity: Advance as tolerated. Pelvic rest for 6 weeks.   Outpatient follow up:6 weeks Follow up Appt:No future appointments. Follow up Visit:No Follow-up on file.  Postpartum contraception: Undecided still  Newborn Data: Live born female  Birth Weight: 6 lb 4.9 oz (2860 g) APGAR: 7, 9  Baby Feeding: Bottle and Breast Disposition:rooming in  due to jaundice   03/11/2015 Nigel BridgemanLATHAM, Tiawanna Luchsinger, CNM

## 2015-03-13 ENCOUNTER — Encounter (HOSPITAL_COMMUNITY): Payer: Self-pay

## 2015-03-13 LAB — TYPE AND SCREEN
ABO/RH(D): A NEG
Antibody Screen: POSITIVE
DAT, IGG: NEGATIVE
UNIT DIVISION: 0
Unit division: 0

## 2016-10-21 ENCOUNTER — Inpatient Hospital Stay (HOSPITAL_COMMUNITY)
Admission: AD | Admit: 2016-10-21 | Discharge: 2016-10-21 | Disposition: A | Discharge status: Home / Self Care | Payer: 59 | Source: Ambulatory Visit | Attending: Obstetrics and Gynecology | Admitting: Obstetrics and Gynecology

## 2016-10-21 ENCOUNTER — Encounter (HOSPITAL_COMMUNITY): Payer: Self-pay

## 2016-10-21 DIAGNOSIS — Z3202 Encounter for pregnancy test, result negative: Secondary | ICD-10-CM | POA: Insufficient documentation

## 2016-10-21 DIAGNOSIS — Z87891 Personal history of nicotine dependence: Secondary | ICD-10-CM | POA: Insufficient documentation

## 2016-10-21 DIAGNOSIS — T8339XA Other mechanical complication of intrauterine contraceptive device, initial encounter: Secondary | ICD-10-CM | POA: Diagnosis present

## 2016-10-21 DIAGNOSIS — T8332XA Displacement of intrauterine contraceptive device, initial encounter: Secondary | ICD-10-CM | POA: Diagnosis not present

## 2016-10-21 LAB — URINALYSIS, ROUTINE W REFLEX MICROSCOPIC
Bilirubin Urine: NEGATIVE
GLUCOSE, UA: NEGATIVE mg/dL
Hgb urine dipstick: NEGATIVE
KETONES UR: NEGATIVE mg/dL
LEUKOCYTES UA: NEGATIVE
Nitrite: NEGATIVE
PROTEIN: NEGATIVE mg/dL
Specific Gravity, Urine: 1.008 (ref 1.005–1.030)
pH: 6 (ref 5.0–8.0)

## 2016-10-21 LAB — POCT PREGNANCY, URINE: Preg Test, Ur: NEGATIVE

## 2016-10-21 NOTE — MAU Note (Signed)
Chief Complaint: IUD complications   None    SUBJECTIVE HPI: Rachael Molina is a 26 y.o. G1P1001 at Unknown who presents to Maternity Admissions reporting longer IUD strings and pain.  Had her first menses after 17 months.  Had intercourse 48 hours ago and starting having pain after that.  Denies discharge or bleeding currently.  No concerns about STD.  Used condom with intercourse.  Location: vaginal pain Severity: 6/10 on pain scale Duration: 48 hours Context: constant  Modifying factors:Ibuprofen   Past Medical History:  Diagnosis Date  . BV (bacterial vaginosis) 06/18/09  . H/O dysmenorrhea 03/24/06  . H/O measles   . H/O varicella   . Migraine    OB History  Gravida Para Term Preterm AB Living  1 1 1     1   SAB TAB Ectopic Multiple Live Births        0 1    # Outcome Date GA Lbr Len/2nd Weight Sex Delivery Anes PTL Lv  1 Term 03/09/15 [redacted]w[redacted]d 09:45 / 00:48 2.86 kg (6 lb 4.9 oz) F Vag-Vacuum EPI  LIV     Past Surgical History:  Procedure Laterality Date  . RECONSTRUCTION OF NOSE     Social History   Social History  . Marital status: Single    Spouse name: N/A  . Number of children: N/A  . Years of education: N/A   Occupational History  . Not on file.   Social History Main Topics  . Smoking status: Former Smoker    Packs/day: 0.50    Quit date: 10/11/2016  . Smokeless tobacco: Never Used  . Alcohol use Yes     Comment: occassional, not hwile pregnant  . Drug use: No  . Sexual activity: Yes    Birth control/ protection: IUD   Other Topics Concern  . Not on file   Social History Narrative  . No narrative on file   Family History  Problem Relation Age of Onset  . Mental illness Mother   . Heart disease Father   . Hypertension Father   . Diabetes Father   . Stroke Father   . Asthma Brother    No current facility-administered medications on file prior to encounter.    No current outpatient prescriptions on file prior to encounter.   No Known  Allergies  I have reviewed patient's Past Medical Hx, Surgical Hx, Family Hx, Social Hx, medications and allergies.   Review of Systems  Constitutional: Negative.   HENT: Negative.   Eyes: Negative.   Respiratory: Negative.   Cardiovascular: Negative.   Gastrointestinal: Negative.   Endocrine: Negative.   Genitourinary: Positive for pelvic pain and vaginal pain.  Musculoskeletal: Negative.   Allergic/Immunologic: Negative.   Neurological: Negative.   Hematological: Negative.   Psychiatric/Behavioral: Negative.     OBJECTIVE Patient Vitals for the past 24 hrs:  BP Temp Temp src Pulse Resp  10/21/16 1412 112/66 97.9 F (36.6 C) Oral 86 16   Constitutional: Well-developed, well-nourished female in no acute distress.  Cardiovascular: normal rate Respiratory: normal rate and effort.  GI: Abd soft, non-tender, gravid appropriate for gestational age. Pos BS x 4 MS: Extremities nontender, no edema, normal ROM Neurologic: Alert and oriented x 4.  GU: Neg CVAT.  SPECULUM EXAM: NEFG, physiologic discharge, no blood noted, cervix clean  BIMANUAL: cervix parous with IUD strings visualized long. IUD appears in cervix.; uterus normal size, no adnexal tenderness or masses.  No CMT. Procedure:  IUD strings grasped with ring forceps.  IUD removed without difficulty. LAB RESULTS Results for orders placed or performed during the hospital encounter of 10/21/16 (from the past 24 hour(s))  Urinalysis, Routine w reflex microscopic     Status: Abnormal   Collection Time: 10/21/16  2:05 PM  Result Value Ref Range   Color, Urine STRAW (A) YELLOW   APPearance CLEAR CLEAR   Specific Gravity, Urine 1.008 1.005 - 1.030   pH 6.0 5.0 - 8.0   Glucose, UA NEGATIVE NEGATIVE mg/dL   Hgb urine dipstick NEGATIVE NEGATIVE   Bilirubin Urine NEGATIVE NEGATIVE   Ketones, ur NEGATIVE NEGATIVE mg/dL   Protein, ur NEGATIVE NEGATIVE mg/dL   Nitrite NEGATIVE NEGATIVE   Leukocytes, UA NEGATIVE NEGATIVE   Pregnancy, urine POC     Status: None   Collection Time: 10/21/16  2:14 PM  Result Value Ref Range   Preg Test, Ur NEGATIVE NEGATIVE    IMAGING No results found.  MAU COURSE Orders Placed This Encounter  Procedures  . Urinalysis, Routine w reflex microscopic  . Diet - low sodium heart healthy  . Increase activity slowly  . Pregnancy, urine POC  . Discharge patient Discharge disposition: 01-Home or Self Care; Discharge patient date: 10/21/2016   No orders of the defined types were placed in this encounter.   MDM UA, UPT, PE, IUD removal ASSESSMENT 1. Displacement of intrauterine contraceptive device, initial encounter   2.  IUD removal  PLAN Discussed use of birth control if does not want to get pregnant.  Contraception options reviewed.  Pt wishes to have Nexplanon. Discussed to follow up in office in two weeks.  Abstain from intercourse for two weeks with negative pregnant test or be on menses.    Discharge home in stable condition.  Follow-up Information    [facility] Follow up in 2 week(s).   Specialty:  Obstetrics and Gynecology Contact information: [address] [place] [address] [phone]         Allergies as of 10/21/2016   No Known Allergies     Medication List    You have not been prescribed any medications.      Kenney Houseman, CNM 10/21/2016  3:25 PM

## 2016-10-21 NOTE — MAU Note (Signed)
Pt has Mirena, has had for 17 months. Has not had a period in almost a year, just had one for the first time 6 days ago.  Since has had cramping and noticed the strings felt lower than they had been previously.
